# Patient Record
Sex: Female | Born: 1970 | Race: Black or African American | Hispanic: No | Marital: Single | Smoking: Never smoker
Health system: Southern US, Community
[De-identification: ages and names within clinical notes are randomized; demographics above are authoritative.]

---

## 2003-03-06 ENCOUNTER — Emergency Department (HOSPITAL_COMMUNITY): Admission: EM | Admit: 2003-03-06 | Discharge: 2003-03-06 | Payer: Self-pay | Admitting: Podiatry

## 2003-04-24 ENCOUNTER — Emergency Department (HOSPITAL_COMMUNITY): Admission: EM | Admit: 2003-04-24 | Discharge: 2003-04-24 | Payer: Self-pay | Admitting: Emergency Medicine

## 2003-06-15 ENCOUNTER — Encounter (INDEPENDENT_AMBULATORY_CARE_PROVIDER_SITE_OTHER): Payer: Self-pay | Admitting: *Deleted

## 2003-06-30 ENCOUNTER — Other Ambulatory Visit: Admission: RE | Admit: 2003-06-30 | Discharge: 2003-06-30 | Payer: Self-pay | Admitting: Family Medicine

## 2003-06-30 ENCOUNTER — Encounter: Admission: RE | Admit: 2003-06-30 | Discharge: 2003-06-30 | Payer: Self-pay | Admitting: Sports Medicine

## 2003-08-26 ENCOUNTER — Emergency Department (HOSPITAL_COMMUNITY): Admission: EM | Admit: 2003-08-26 | Discharge: 2003-08-26 | Payer: Self-pay | Admitting: Emergency Medicine

## 2003-08-26 ENCOUNTER — Encounter: Admission: RE | Admit: 2003-08-26 | Discharge: 2003-08-26 | Payer: Self-pay | Admitting: Family Medicine

## 2006-02-19 ENCOUNTER — Emergency Department (HOSPITAL_COMMUNITY): Admission: EM | Admit: 2006-02-19 | Discharge: 2006-02-19 | Payer: Self-pay | Admitting: *Deleted

## 2006-02-20 ENCOUNTER — Ambulatory Visit: Payer: Self-pay | Admitting: *Deleted

## 2006-03-16 ENCOUNTER — Ambulatory Visit: Payer: Self-pay | Admitting: Family Medicine

## 2006-05-02 ENCOUNTER — Ambulatory Visit: Payer: Self-pay | Admitting: Family Medicine

## 2006-10-12 ENCOUNTER — Encounter (INDEPENDENT_AMBULATORY_CARE_PROVIDER_SITE_OTHER): Payer: Self-pay | Admitting: *Deleted

## 2007-05-01 ENCOUNTER — Encounter (INDEPENDENT_AMBULATORY_CARE_PROVIDER_SITE_OTHER): Payer: Self-pay | Admitting: *Deleted

## 2010-09-04 ENCOUNTER — Encounter: Payer: Self-pay | Admitting: Family Medicine

## 2013-10-16 ENCOUNTER — Emergency Department (INDEPENDENT_AMBULATORY_CARE_PROVIDER_SITE_OTHER)
Admission: EM | Admit: 2013-10-16 | Discharge: 2013-10-16 | Disposition: A | Discharge status: Home / Self Care | Payer: Self-pay | Source: Home / Self Care | Attending: Emergency Medicine | Admitting: Emergency Medicine

## 2013-10-16 ENCOUNTER — Other Ambulatory Visit (HOSPITAL_COMMUNITY)
Admission: RE | Admit: 2013-10-16 | Discharge: 2013-10-16 | Disposition: A | Discharge status: Home / Self Care | Payer: Self-pay | Source: Ambulatory Visit | Attending: Emergency Medicine | Admitting: Emergency Medicine

## 2013-10-16 ENCOUNTER — Encounter (HOSPITAL_COMMUNITY): Payer: Self-pay | Admitting: Emergency Medicine

## 2013-10-16 ENCOUNTER — Emergency Department (INDEPENDENT_AMBULATORY_CARE_PROVIDER_SITE_OTHER): Payer: Self-pay

## 2013-10-16 DIAGNOSIS — R1032 Left lower quadrant pain: Secondary | ICD-10-CM

## 2013-10-16 DIAGNOSIS — S239XXA Sprain of unspecified parts of thorax, initial encounter: Secondary | ICD-10-CM

## 2013-10-16 DIAGNOSIS — Z113 Encounter for screening for infections with a predominantly sexual mode of transmission: Secondary | ICD-10-CM | POA: Insufficient documentation

## 2013-10-16 DIAGNOSIS — N760 Acute vaginitis: Secondary | ICD-10-CM | POA: Insufficient documentation

## 2013-10-16 DIAGNOSIS — IMO0002 Reserved for concepts with insufficient information to code with codable children: Secondary | ICD-10-CM

## 2013-10-16 LAB — POCT I-STAT, CHEM 8
BUN: 5 mg/dL — AB (ref 6–23)
Calcium, Ion: 1.2 mmol/L (ref 1.12–1.23)
Chloride: 101 mEq/L (ref 96–112)
Creatinine, Ser: 0.7 mg/dL (ref 0.50–1.10)
Glucose, Bld: 109 mg/dL — ABNORMAL HIGH (ref 70–99)
HCT: 41 % (ref 36.0–46.0)
Hemoglobin: 13.9 g/dL (ref 12.0–15.0)
Potassium: 3.6 mEq/L — ABNORMAL LOW (ref 3.7–5.3)
Sodium: 140 mEq/L (ref 137–147)
TCO2: 27 mmol/L (ref 0–100)

## 2013-10-16 LAB — CBC WITH DIFFERENTIAL/PLATELET
BASOS ABS: 0 10*3/uL (ref 0.0–0.1)
BASOS PCT: 0 % (ref 0–1)
EOS PCT: 1 % (ref 0–5)
Eosinophils Absolute: 0.1 10*3/uL (ref 0.0–0.7)
HEMATOCRIT: 38.8 % (ref 36.0–46.0)
Hemoglobin: 13.1 g/dL (ref 12.0–15.0)
LYMPHS PCT: 40 % (ref 12–46)
Lymphs Abs: 3.6 10*3/uL (ref 0.7–4.0)
MCH: 31.2 pg (ref 26.0–34.0)
MCHC: 33.8 g/dL (ref 30.0–36.0)
MCV: 92.4 fL (ref 78.0–100.0)
Monocytes Absolute: 0.5 10*3/uL (ref 0.1–1.0)
Monocytes Relative: 5 % (ref 3–12)
Neutro Abs: 4.8 10*3/uL (ref 1.7–7.7)
Neutrophils Relative %: 54 % (ref 43–77)
Platelets: 358 10*3/uL (ref 150–400)
RBC: 4.2 MIL/uL (ref 3.87–5.11)
RDW: 13.1 % (ref 11.5–15.5)
WBC: 9 10*3/uL (ref 4.0–10.5)

## 2013-10-16 LAB — CERVICOVAGINAL ANCILLARY ONLY
WET PREP (BD AFFIRM): NEGATIVE
Wet Prep (BD Affirm): NEGATIVE
Wet Prep (BD Affirm): POSITIVE — AB

## 2013-10-16 LAB — POCT URINALYSIS DIP (DEVICE)
Bilirubin Urine: NEGATIVE
GLUCOSE, UA: NEGATIVE mg/dL
HGB URINE DIPSTICK: NEGATIVE
KETONES UR: NEGATIVE mg/dL
Leukocytes, UA: NEGATIVE
Nitrite: NEGATIVE
PH: 6.5 (ref 5.0–8.0)
Protein, ur: NEGATIVE mg/dL
SPECIFIC GRAVITY, URINE: 1.025 (ref 1.005–1.030)
Urobilinogen, UA: 0.2 mg/dL (ref 0.0–1.0)

## 2013-10-16 LAB — POCT PREGNANCY, URINE: PREG TEST UR: NEGATIVE

## 2013-10-16 MED ORDER — METHOCARBAMOL 500 MG PO TABS: 500.0000 mg | ORAL_TABLET | Freq: Three times a day (TID) | ORAL | Status: DC

## 2013-10-16 MED ORDER — CIPROFLOXACIN HCL 500 MG PO TABS: 500.0000 mg | ORAL_TABLET | Freq: Two times a day (BID) | ORAL | Status: DC

## 2013-10-16 MED ORDER — METRONIDAZOLE 500 MG PO TABS: 500.0000 mg | ORAL_TABLET | Freq: Two times a day (BID) | ORAL | Status: DC

## 2013-10-16 MED ORDER — MELOXICAM 15 MG PO TABS: 15.0000 mg | ORAL_TABLET | Freq: Every day | ORAL | Status: DC

## 2013-10-16 NOTE — ED Notes (Signed)
Evaluated by dr Lorenz Coasterkeller prior to this nurse

## 2013-10-16 NOTE — Discharge Instructions (Signed)
Urinary Tract Infection Urinary tract infections (UTIs) can develop anywhere along your urinary tract. Your urinary tract is your body's drainage system for removing wastes and extra water. Your urinary tract includes two kidneys, two ureters, a bladder, and a urethra. Your kidneys are a pair of bean-shaped organs. Each kidney is about the size of your fist. They are located below your ribs, one on each side of your spine. CAUSES Infections are caused by microbes, which are microscopic organisms, including fungi, viruses, and bacteria. These organisms are so small that they can only be seen through a microscope. Bacteria are the microbes that most commonly cause UTIs. SYMPTOMS  Symptoms of UTIs may vary by age and gender of the patient and by the location of the infection. Symptoms in young women typically include a frequent and intense urge to urinate and a painful, burning feeling in the bladder or urethra during urination. Older women and men are more likely to be tired, shaky, and weak and have muscle aches and abdominal pain. A fever may mean the infection is in your kidneys. Other symptoms of a kidney infection include pain in your back or sides below the ribs, nausea, and vomiting. DIAGNOSIS To diagnose a UTI, your caregiver will ask you about your symptoms. Your caregiver also will ask to provide a urine sample. The urine sample will be tested for bacteria and white blood cells. White blood cells are made by your body to help fight infection. TREATMENT  Typically, UTIs can be treated with medication. Because most UTIs are caused by a bacterial infection, they usually can be treated with the use of antibiotics. The choice of antibiotic and length of treatment depend on your symptoms and the type of bacteria causing your infection. HOME CARE INSTRUCTIONS If you were prescribed antibiotics, take them exactly as your caregiver instructs you. Finish the medication even if you feel better after you have  only taken some of the medication. Drink enough water and fluids to keep your urine clear or pale yellow. Avoid caffeine, tea, and carbonated beverages. They tend to irritate your bladder. Empty your bladder often. Avoid holding urine for long periods of time. Empty your bladder before and after sexual intercourse. After a bowel movement, women should cleanse from front to back. Use each tissue only once. SEEK MEDICAL CARE IF:  You have back pain. You develop a fever. Your symptoms do not begin to resolve within 3 days. SEEK IMMEDIATE MEDICAL CARE IF:  You have severe back pain or lower abdominal pain. You develop chills. You have nausea or vomiting. You have continued burning or discomfort with urination. MAKE SURE YOU:  Understand these instructions. Will watch your condition. Will get help right away if you are not doing well or get worse. Document Released: 05/10/2005 Document Revised: 01/30/2012 Document Reviewed: 09/08/2011 [facility] Patient Information 2014 [place], [place]. Diverticulitis A diverticulum is a small pouch or sac on the colon. Diverticulosis is the presence of these diverticula on the colon. Diverticulitis is the irritation (inflammation) or infection of diverticula. CAUSES  The colon and its diverticula contain bacteria. If food particles block the tiny opening to a diverticulum, the bacteria inside can grow and cause an increase in pressure. This leads to infection and inflammation and is called diverticulitis. SYMPTOMS   Abdominal pain and tenderness. Usually, the pain is located on the left side of your abdomen. However, it could be located elsewhere.  Fever.  Bloating.  Feeling sick to your stomach (nausea).  Throwing up (vomiting).  Abnormal stools. DIAGNOSIS  Your caregiver will take a history and perform a physical exam. Since many things can cause abdominal pain, other tests may be necessary. Tests may include:  Blood tests.  Urine  tests.  X-ray of the abdomen.  CT scan of the abdomen. Sometimes, surgery is needed to determine if diverticulitis or other conditions are causing your symptoms. TREATMENT  Most of the time, you can be treated without surgery. Treatment includes:  Resting the bowels by only having liquids for a few days. As you improve, you will need to eat a low-fiber diet.  Intravenous (IV) fluids if you are losing body fluids (dehydrated).  Antibiotic medicines that treat infections may be given.  Pain and nausea medicine, if needed.  Surgery if the inflamed diverticulum has burst. HOME CARE INSTRUCTIONS   Try a clear liquid diet (broth, tea, or water for as long as directed by your caregiver). You may then gradually begin a low-fiber diet as tolerated.  A low-fiber diet is a diet with less than 10 grams of fiber. Choose the foods below to reduce fiber in the diet:  White breads, cereals, rice, and pasta.  Cooked fruits and vegetables or soft fresh fruits and vegetables without the skin.  Ground or well-cooked tender beef, ham, veal, lamb, pork, or poultry.  Eggs and seafood.  After your diverticulitis symptoms have improved, your caregiver may put you on a high-fiber diet. A high-fiber diet includes 14 grams of fiber for every 1000 calories consumed. For a standard 2000 calorie diet, you would need 28 grams of fiber. Follow these diet guidelines to help you increase the fiber in your diet. It is important to slowly increase the amount fiber in your diet to avoid gas, constipation, and bloating.  Choose whole-grain breads, cereals, pasta, and [place] rice.  Choose fresh fruits and vegetables with the skin on. Do not overcook vegetables because the more vegetables are cooked, the more fiber is lost.  Choose more nuts, seeds, legumes, dried peas, beans, and lentils.  Look for food products that have greater than 3 grams of fiber per serving on the Nutrition Facts label.  Take all medicine as  directed by your caregiver.  If your caregiver has given you a follow-up appointment, it is very important that you go. Not going could result in lasting (chronic) or permanent injury, pain, and disability. If there is any problem keeping the appointment, call to reschedule. SEEK MEDICAL CARE IF:   Your pain does not improve.  You have a hard time advancing your diet beyond clear liquids.  Your bowel movements do not return to normal. SEEK IMMEDIATE MEDICAL CARE IF:   Your pain becomes worse.  You have an oral temperature above 102 F (38.9 C), not controlled by medicine.  You have repeated vomiting.  You have bloody or black, tarry stools.  Symptoms that brought you to your caregiver become worse or are not getting better. MAKE SURE YOU:   Understand these instructions.  Will watch your condition.  Will get help right away if you are not doing well or get worse. Document Released: 05/10/2005 Document Revised: 10/23/2011 Document Reviewed: 09/05/2010 [facility] Patient Information 2014 [place], [place].  Do exercises twice daily followed by moist heat for 15 minutes.      Try to be as active as possible.  If no better in 2 weeks, follow up with orthopedist.

## 2013-10-16 NOTE — ED Notes (Signed)
Patient care delayed secondary to department acuity

## 2013-10-16 NOTE — ED Provider Notes (Signed)
Chief Complaint   Left lower quadrant pain and left upper back pain.  History of Present Illness   Faith Taylor is a 43 year old female who has had a four-day history of crampy left lower quadrant pain when she urinates and left upper back pain around the shoulder blade. She denies any dysuria, frequency, urgency, or hematuria. She has noted some odor to her urine. She's felt chilled not had any fevers. No vomiting, nausea, anorexia, or diarrhea. No blood in the stool. She does have a history of urinary tract infections in the past. The last one was last summer. She denies any GYN complaints such as discharge, itching, but she does have some pelvic pain. She has a Mirena IUD in place. The upper back pain is worse with certain movements and positions. She thinks it might have been due to carrying a heavy book bag in school.  Review of Systems   Other than as noted above, the patient denies any of the following symptoms: General:  No fevers, chills, or sweats. GI:  No abdominal pain, back pain, nausea, vomiting, diarrhea, or constipation. GU:  No dysuria, frequency, urgency, hematuria, or incontinence. GYN:  No discharge, itching, vulvar pain or lesions, pelvic pain, or abnormal vaginal bleeding.  PMFSH   Past medical history, family history, social history, meds, and allergies were reviewed.    Physical Examination     Vital signs:  BP 156/84  Pulse 90  Temp(Src) 98.4 F (36.9 C) (Oral)  Resp 16  SpO2 98%  LMP 09/05/2013 Gen:  Alert, oriented, in no distress. Lungs:  Clear to auscultation, no wheezes, rales or rhonchi. Heart:  Regular rhythm, no gallop or murmer. Back: There was mild left CVA tenderness to palpation and more marked at tenderness to palpation in the upper back just adjacent to the shoulder blade on the left Abdomen:  Flat and soft. There was slight right lower quadrant and left lower quadrant pain to palpation.  No guarding, or rebound.  No hepato-splenomegaly  or mass.  Bowel sounds were normally active.  No hernia. Pelvic exam: Normal external genitalia. Vaginal and cervical mucosa were normal. There was no discharge. IUD string was not seen or felt. There was no pain on cervical motion. Uterus was normal size and shape and nontender. No adnexal masses or tenderness.  DNA probes for gonorrhea, Chlamydia, Trichomonas, Gardnerella, and Candida were obtained. Back:  No CVA tenderness.  Skin:  Clear, warm and dry.  Labs   Results for orders placed during the hospital encounter of 10/16/13  CBC WITH DIFFERENTIAL      Result Value Ref Range   WBC 9.0  4.0 - 10.5 K/uL   RBC 4.20  3.87 - 5.11 MIL/uL   Hemoglobin 13.1  12.0 - 15.0 g/dL   HCT 40.9  81.1 - 91.4 %   MCV 92.4  78.0 - 100.0 fL   MCH 31.2  26.0 - 34.0 pg   MCHC 33.8  30.0 - 36.0 g/dL   RDW 78.2  95.6 - 21.3 %   Platelets 358  150 - 400 K/uL   Neutrophils Relative % 54  43 - 77 %   Neutro Abs 4.8  1.7 - 7.7 K/uL   Lymphocytes Relative 40  12 - 46 %   Lymphs Abs 3.6  0.7 - 4.0 K/uL   Monocytes Relative 5  3 - 12 %   Monocytes Absolute 0.5  0.1 - 1.0 K/uL   Eosinophils Relative 1  0 - 5 %  Eosinophils Absolute 0.1  0.0 - 0.7 K/uL   Basophils Relative 0  0 - 1 %   Basophils Absolute 0.0  0.0 - 0.1 K/uL  POCT URINALYSIS DIP (DEVICE)      Result Value Ref Range   Glucose, UA NEGATIVE  NEGATIVE mg/dL   Bilirubin Urine NEGATIVE  NEGATIVE   Ketones, ur NEGATIVE  NEGATIVE mg/dL   Specific Gravity, Urine 1.025  1.005 - 1.030   Hgb urine dipstick NEGATIVE  NEGATIVE   pH 6.5  5.0 - 8.0   Protein, ur NEGATIVE  NEGATIVE mg/dL   Urobilinogen, UA 0.2  0.0 - 1.0 mg/dL   Nitrite NEGATIVE  NEGATIVE   Leukocytes, UA NEGATIVE  NEGATIVE  POCT PREGNANCY, URINE      Result Value Ref Range   Preg Test, Ur NEGATIVE  NEGATIVE  POCT I-STAT, CHEM 8      Result Value Ref Range   Sodium 140  137 - 147 mEq/L   Potassium 3.6 (*) 3.7 - 5.3 mEq/L   Chloride 101  96 - 112 mEq/L   BUN 5 (*) 6 - 23 mg/dL    Creatinine, Ser 8.11  0.50 - 1.10 mg/dL   Glucose, Bld 914 (*) 70 - 99 mg/dL   Calcium, Ion 7.82  1.12 - 1.23 mmol/L   TCO2 27  0 - 100 mmol/L   Hemoglobin 13.9  12.0 - 15.0 g/dL   HCT 95.6  21.3 - 08.6 %    A urine culture was obtained.  Results are pending at this time and we will call about any positive results.  Radiology   Dg Abd 1 View  10/16/2013   CLINICAL DATA:  Mid back pain and abdominal cramping  EXAM: ABDOMEN - 1 VIEW  COMPARISON:  None.  FINDINGS: There is a moderate stool burden within the colon. There is no evidence of bowel obstruction. No abnormal soft tissue calcifications are demonstrated. The bony structures are normal in appearance were visualized. An IUD is in place.  IMPRESSION: There is mildly increased stool burden which may reflect constipation in the appropriate clinical setting. There is no evidence of bowel obstruction or ileus nor other acute intra-abdominal abnormality.   Electronically Signed   By: Jomar Denz  [place]   On: 10/16/2013 10:19   I reviewed the images independently and personally and concur with the radiologist's findings.  Assessment   The primary encounter diagnosis was LLQ pain. A diagnosis of Thoracic sprain and strain was also pertinent to this visit.   No evidence of pyelonephritis.    Differential diagnosis of left lower quadrant pain his urinary tract infection, mild diverticulitis, and I do not think she has PID or appendicitis. Differential diagnosis of the upper back pain is muscular strain versus kidney pain. I think any pain is unlikely given her normal urinalysis.   Plan   1.  Meds:  The following meds were prescribed:   New Prescriptions   CIPROFLOXACIN (CIPRO) 500 MG TABLET    Take 1 tablet (500 mg total) by mouth every 12 (twelve) hours.   MELOXICAM (MOBIC) 15 MG TABLET    Take 1 tablet (15 mg total) by mouth daily.   METHOCARBAMOL (ROBAXIN) 500 MG TABLET    Take 1 tablet (500 mg total) by mouth 3 (three) times daily.    METRONIDAZOLE (FLAGYL) 500 MG TABLET    Take 1 tablet (500 mg total) by mouth 2 (two) times daily.    2.  Patient Education/Counseling:  The patient was given  appropriate handouts, self care instructions, and instructed in symptomatic relief. The patient was told to avoid intercourse for 10 days, get extra fluids, and return for a follow up with her primary care doctor at the completion of treatment for a repeat UA and culture.  Given back exercises.  3.  Follow up:  The patient was told to follow up here if no better in 3 to 4 days, or sooner if becoming worse in any way, and given some red flag symptoms such as fever, persistent vomiting, or severe flank or abdominal pain which would prompt immediate return.     Reuben Likesavid C Margretta Zamorano, MD 10/16/13 1106

## 2013-10-17 LAB — URINE CULTURE

## 2013-10-17 LAB — CERVICOVAGINAL ANCILLARY ONLY
Chlamydia: NEGATIVE
Neisseria Gonorrhea: NEGATIVE

## 2013-10-17 NOTE — ED Notes (Signed)
GC/Chlamydia neg., Affirm: Candida and Trich neg., Gardnerella pos., Urine culture:  15,000 colonies multiple bacterial types none predominant. Pt. adequately treated with Flagyl. Vassie MoselleYork, Tilla Wilborn M 10/17/2013

## 2014-09-10 ENCOUNTER — Emergency Department (INDEPENDENT_AMBULATORY_CARE_PROVIDER_SITE_OTHER)
Admission: EM | Admit: 2014-09-10 | Discharge: 2014-09-10 | Disposition: A | Discharge status: Home / Self Care | Payer: Self-pay | Source: Home / Self Care | Attending: Family Medicine | Admitting: Family Medicine

## 2014-09-10 ENCOUNTER — Encounter (HOSPITAL_COMMUNITY): Payer: Self-pay | Admitting: Emergency Medicine

## 2014-09-10 DIAGNOSIS — R6889 Other general symptoms and signs: Secondary | ICD-10-CM

## 2014-09-10 LAB — POCT RAPID STREP A: Streptococcus, Group A Screen (Direct): NEGATIVE

## 2014-09-10 NOTE — ED Provider Notes (Signed)
CSN: [id number]     Arrival date & time 09/10/14  0900 History   First MD Initiated Contact with Patient 09/10/14 [phone]     Chief Complaint  Patient presents with  . Fever  . Generalized Body Aches  . Cough  . Diarrhea   (Consider location/radiation/quality/duration/timing/severity/associated sxs/prior Treatment) HPI  5 days ago developed fatigue and aches. 4 days ago developed fever to 102.6 adn cough. Associated w/ sore throat and occasional phlegm production, and mild intermittent post nasal drip. Theraflu and motrin w/ some benefit.  Overall no change in overall condition. Symptoms are worse at night. deneis abd pain, dysuria, rash, CP, SOB.     History reviewed. No pertinent past medical history. History reviewed. No pertinent past surgical history. History reviewed. No pertinent family history. History  Substance Use Topics  . Smoking status: Never Smoker   . Smokeless tobacco: Never Used  . Alcohol Use: No   OB History    No data available     Review of Systems Per HPI with all other pertinent systems negative.   Allergies  Review of patient's allergies indicates no known allergies.  Home Medications   Prior to Admission medications   Medication Sig Start Date End Date Taking? Authorizing Provider  ciprofloxacin (CIPRO) 500 MG tablet Take 1 tablet (500 mg total) by mouth every 12 (twelve) hours. 10/16/13   Reuben Likesavid C Keller, MD  meloxicam (MOBIC) 15 MG tablet Take 1 tablet (15 mg total) by mouth daily. 10/16/13   Reuben Likesavid C Keller, MD  methocarbamol (ROBAXIN) 500 MG tablet Take 1 tablet (500 mg total) by mouth 3 (three) times daily. 10/16/13   Reuben Likesavid C Keller, MD  metroNIDAZOLE (FLAGYL) 500 MG tablet Take 1 tablet (500 mg total) by mouth 2 (two) times daily. 10/16/13   Reuben Likesavid C Keller, MD   BP 151/93 mmHg  Pulse 116  Temp(Src) 98.3 F (36.8 C) (Oral)  Resp 16  SpO2 100%  LMP 09/01/2014 (Exact Date) Physical Exam  Constitutional: She is oriented to person, place, and time.  She appears well-developed and well-nourished. No distress.  HENT:  Head: Normocephalic and atraumatic.  Eyes: EOM are normal. Pupils are equal, round, and reactive to light.  Neck: Normal range of motion.  Cardiovascular: Normal rate, normal heart sounds and intact distal pulses.  Exam reveals no gallop.   No murmur heard. Pulmonary/Chest: Effort normal and breath sounds normal.  Abdominal: Soft. Bowel sounds are normal.  Musculoskeletal: She exhibits no edema or tenderness.  Neurological: She is alert and oriented to person, place, and time. No cranial nerve deficit. Coordination normal.  Skin: Skin is warm. She is not diaphoretic.  Psychiatric: She has a normal mood and affect. Her behavior is normal. Judgment and thought content normal.    ED Course  Procedures (including critical care time) Labs Review Labs Reviewed - No data to display  Imaging Review No results found.   MDM   1. Flu-like symptoms    Likely flu. Outside window for tamiflu NSaids and tylenol, fluids and rest Rapid strep negative.  Precautions given and all questions answered  Shelly Flattenavid Merrell, MD Family Medicine 09/10/2014, 9:46 AM      Ozella Rocksavid J Merrell, MD 09/10/14 [id number]

## 2014-09-10 NOTE — Discharge Instructions (Signed)
You likely have the flu This should start to get better in the next 1-3 days Please start taking a probiotic and or yogurt to help with the diarrhea. Please use tylenol and ibuprofen for fevers and aches Please stay well hydrated and get lots or rest.  Please call if you do not get better in another r3-4 days

## 2014-09-10 NOTE — ED Notes (Signed)
Pt has been suffering from a cough, fever, diarrhea and generalized body aches for the last five days.

## 2014-09-12 LAB — CULTURE, GROUP A STREP

## 2014-09-15 ENCOUNTER — Emergency Department (INDEPENDENT_AMBULATORY_CARE_PROVIDER_SITE_OTHER): Payer: Self-pay

## 2014-09-15 ENCOUNTER — Emergency Department (INDEPENDENT_AMBULATORY_CARE_PROVIDER_SITE_OTHER)
Admission: EM | Admit: 2014-09-15 | Discharge: 2014-09-15 | Disposition: A | Discharge status: Home / Self Care | Payer: Self-pay | Source: Home / Self Care | Attending: Family Medicine | Admitting: Family Medicine

## 2014-09-15 ENCOUNTER — Encounter (HOSPITAL_COMMUNITY): Payer: Self-pay | Admitting: Emergency Medicine

## 2014-09-15 DIAGNOSIS — J9811 Atelectasis: Secondary | ICD-10-CM

## 2014-09-15 DIAGNOSIS — J189 Pneumonia, unspecified organism: Secondary | ICD-10-CM

## 2014-09-15 MED ORDER — LEVOFLOXACIN 500 MG PO TABS: 500.0000 mg | ORAL_TABLET | Freq: Every day | ORAL | Status: DC

## 2014-09-15 NOTE — ED Provider Notes (Signed)
Faith Taylor is a 44 y.o. female who presents to Urgent Care today for Body aches fever headache and cough. Symptoms present now for about a week. Patient was seen on the 28th and thought to have flu. She's tried TheraFlu and Robitussin and Motrin which have helped a bit. Recently she developed a barking cough. No trouble breathing or wheezing. Cough is productive.   History reviewed. No pertinent past medical history. History reviewed. No pertinent past surgical history. History  Substance Use Topics  . Smoking status: Never Smoker   . Smokeless tobacco: Never Used  . Alcohol Use: No   ROS as above Medications: No current facility-administered medications for this encounter.   Current Outpatient Prescriptions  Medication Sig Dispense Refill  . levofloxacin (LEVAQUIN) 500 MG tablet Take 1 tablet (500 mg total) by mouth daily. 10 tablet 0  . meloxicam (MOBIC) 15 MG tablet Take 1 tablet (15 mg total) by mouth daily. 15 tablet 0  . methocarbamol (ROBAXIN) 500 MG tablet Take 1 tablet (500 mg total) by mouth 3 (three) times daily. 30 tablet 0   No Known Allergies   Exam:  BP 141/97 mmHg  Pulse 107  Temp(Src) 98.3 F (36.8 C) (Oral)  Resp 16  SpO2 95%  LMP 09/01/2014 (Exact Date) Gen: Well NAD HEENT: EOMI,  MMM Lungs: Normal work of breathing. Course right-sided breath sounds Heart: RRR no MRG Abd: NABS, Soft. Nondistended, Nontender Exts: Brisk capillary refill, warm and well perfused.   No results found for this or any previous visit (from the past 24 hour(s)). Dg Chest 2 View  09/15/2014   CLINICAL DATA:  Patient has a for 1 week.  EXAM: CHEST  2 VIEW  COMPARISON:  None.  FINDINGS: There is elevation of the right diaphragm. There is a linear band of airspace disease at the left lung base likely reflecting atelectasis or scarring. There is no focal parenchymal opacity, pleural effusion, or pneumothorax. The heart and mediastinal contours are unremarkable.  The osseous  structures are unremarkable.  IMPRESSION: There is a linear band of airspace disease at the left lung base likely reflecting atelectasis or scarring. No active cardiopulmonary disease.   Electronically Signed   By: Elige KoHetal  Patel   On: 09/15/2014 19:53    Assessment and Plan: 44 y.o. female with atelectasis and probable community-acquired pneumonia. Treat with Levaquin. Discussed with radiology who recommends repeat chest x-ray in a few days. Return to clinic in 3 days. Additionally we'll use an incentive spirometer  Discussed warning signs or symptoms. Please see discharge instructions. Patient expresses understanding.     Rodolph BongEvan S Anya Murphey, MD 09/15/14 2026

## 2014-09-15 NOTE — Discharge Instructions (Signed)
Thank you for coming in today. Call or go to the emergency room if you get worse, have trouble breathing, have chest pains, or palpitations Return in 3 days for repeat chest x-ray Use the incentive spirometer   Pneumonia Pneumonia is an infection of the lungs.  CAUSES Pneumonia may be caused by bacteria or a virus. Usually, these infections are caused by breathing infectious particles into the lungs (respiratory tract). SIGNS AND SYMPTOMS   Cough.  Fever.  Chest pain.  Increased rate of breathing.  Wheezing.  Mucus production. DIAGNOSIS  If you have the common symptoms of pneumonia, your health care provider will typically confirm the diagnosis with a chest X-ray. The X-ray will show an abnormality in the lung (pulmonary infiltrate) if you have pneumonia. Other tests of your blood, urine, or sputum may be done to find the specific cause of your pneumonia. Your health care provider may also do tests (blood gases or pulse oximetry) to see how well your lungs are working. TREATMENT  Some forms of pneumonia may be spread to other people when you cough or sneeze. You may be asked to wear a mask before and during your exam. Pneumonia that is caused by bacteria is treated with antibiotic medicine. Pneumonia that is caused by the influenza virus may be treated with an antiviral medicine. Most other viral infections must run their course. These infections will not respond to antibiotics.  HOME CARE INSTRUCTIONS   Cough suppressants may be used if you are losing too much rest. However, coughing protects you by clearing your lungs. You should avoid using cough suppressants if you can.  Your health care provider may have prescribed medicine if he or she thinks your pneumonia is caused by bacteria or influenza. Finish your medicine even if you start to feel better.  Your health care provider may also prescribe an expectorant. This loosens the mucus to be coughed up.  Take medicines only as  directed by your health care provider.  Do not smoke. Smoking is a common cause of bronchitis and can contribute to pneumonia. If you are a smoker and continue to smoke, your cough may last several weeks after your pneumonia has cleared.  A cold steam vaporizer or humidifier in your room or home may help loosen mucus.  Coughing is often worse at night. Sleeping in a semi-upright position in a recliner or using a couple pillows under your head will help with this.  Get rest as you feel it is needed. Your body will usually let you know when you need to rest. PREVENTION A pneumococcal shot (vaccine) is available to prevent a common bacterial cause of pneumonia. This is usually suggested for:  People over 47 years old.  Patients on chemotherapy.  People with chronic lung problems, such as bronchitis or emphysema.  People with immune system problems. If you are over 65 or have a high risk condition, you may receive the pneumococcal vaccine if you have not received it before. In some countries, a routine influenza vaccine is also recommended. This vaccine can help prevent some cases of pneumonia.You may be offered the influenza vaccine as part of your care. If you smoke, it is time to quit. You may receive instructions on how to stop smoking. Your health care provider can provide medicines and counseling to help you quit. SEEK MEDICAL CARE IF: You have a fever. SEEK IMMEDIATE MEDICAL CARE IF:   Your illness becomes worse. This is especially true if you are elderly or weakened from  any other disease.  You cannot control your cough with suppressants and are losing sleep.  You begin coughing up blood.  You develop pain which is getting worse or is uncontrolled with medicines.  Any of the symptoms which initially brought you in for treatment are getting worse rather than better.  You develop shortness of breath or chest pain. MAKE SURE YOU:   Understand these instructions.  Will watch  your condition.  Will get help right away if you are not doing well or get worse. Document Released: 07/31/2005 Document Revised: 12/15/2013 Document Reviewed: 10/20/2010 [employer] Patient Information 2015 [place], [place]. This information is not intended to replace advice given to you by your health care provider. Make sure you discuss any questions you have with your health care provider.    Atelectasis Atelectasis is a collapse of the small air sacs in the lungs (alveoli). When this occurs, all or part of a lung collapses and becomes airless. It can be caused by various things and is a common problem after surgery. The severity of atelectasis will vary depending on the size of the area involved and the underlying cause of the condition. CAUSES  There are multiple causes for atelectasis:   Shallow breathing, particularly if there is an injury to your chest wall or abdomen that makes it painful to take a deep breath. This commonly occurs after surgery.  Obstruction of your airways (bronchi or bronchioles). This may be caused by a buildup of mucus (mucus plug), tumors, blood clots (pulmonary embolus), or inhaled foreign bodies. Mucus plugs occur when the lungs do not expand enough to get rid of mucus.  Outside pressure on the lung. This may be caused by tumors, fluid (pleural effusion), or a leakage of air between the lung and rib cage (pneumothorax).   Infections such as pneumonia.  Scarring in lung tissue left over from previous infection or injury.  Some diseases such as cystic fibrosis. SIGNS AND SYMPTOMS  Often, atelectasis will have no symptoms. When symptoms occur, they include:  Shortness of breath.   Bluish color to your nails, lips, or mouth (cyanosis). DIAGNOSIS  Your health care provider may suspect atelectasis based on symptoms and physical findings. A chest X-ray may be done to confirm the diagnosis. More specialized X-ray exams are sometimes required.  TREATMENT    Treatment will depend on the cause of the atelectasis. Treatment may include:  Purposeful coughing to loosen mucus plugs in the lungs.  Chest physiotherapy. This consists of clapping or percussion on the chest over the lungs to further loosen mucus plugs.  Postural drainage techniques. This involves positioning your body so your head is lower than your chest. HOME CARE INSTRUCTIONS  Practice relaxed deep breathing whenever you are sitting down. A good technique is to take a few relaxed deep breaths each time a commercial comes on if you are watching television.  If you were given a deep breathing device (such as an incentive spirometer) or a mucus clearance device, use this regularly as directed by your health care provider.  Try to cough several times a day as directed by your health care provider.  Perform any chest physiotherapy or postural drainage techniques as directed by your health care provider. If necessary, have someone (such as a family member) assist you with these techniques.  When lying down, lie on the unaffected side to encourage mucus drainage.  Stay physically active as much as possible. SEEK IMMEDIATE MEDICAL CARE IF:   You develop increasing problems with  your breathing.   You develop severe chest pain.   You develop severe coughing, or you cough up blood.   You have a fever or persistent symptoms for more than 2-3 days.   You have a fever and your symptoms suddenly get worse.  MAKE SURE YOU:  Understand these instructions.  Will watch your condition.  Will get help right away if you are not doing well or get worse. Document Released: 07/31/2005 Document Revised: 08/05/2013 Document Reviewed: 02/05/2013 [facility] Patient Information 2015 [place], [place]. This information is not intended to replace advice given to you by your health care provider. Make sure you discuss any questions you have with your health care provider.

## 2014-09-15 NOTE — ED Notes (Signed)
Pt reports being seen on 1/28.  States not feeling any better.   On set of fever and headache last night.  Productive cough with yellow sputum.  Mild sob, with activity.   Symptoms worse at night.  Feeling light headed during the day.  No relief with otc meds.

## 2014-09-18 ENCOUNTER — Emergency Department (INDEPENDENT_AMBULATORY_CARE_PROVIDER_SITE_OTHER)
Admission: EM | Admit: 2014-09-18 | Discharge: 2014-09-18 | Disposition: A | Discharge status: Home / Self Care | Payer: Self-pay | Source: Home / Self Care | Attending: Emergency Medicine | Admitting: Emergency Medicine

## 2014-09-18 ENCOUNTER — Encounter (HOSPITAL_COMMUNITY): Payer: Self-pay

## 2014-09-18 ENCOUNTER — Emergency Department (INDEPENDENT_AMBULATORY_CARE_PROVIDER_SITE_OTHER): Payer: Self-pay

## 2014-09-18 DIAGNOSIS — J189 Pneumonia, unspecified organism: Secondary | ICD-10-CM

## 2014-09-18 NOTE — Discharge Instructions (Signed)
Your x-ray looks better, but you do have pneumonia. Please finish the course of Levaquin. Follow-up if your symptoms do not continue to improve or if you develop fevers again.

## 2014-09-18 NOTE — ED Provider Notes (Signed)
CSN: [id number]     Arrival date & time 09/18/14  [id number] History   First MD Initiated Contact with Patient 09/18/14 [phone]     Chief Complaint  Patient presents with  . Follow-up   (Consider location/radiation/quality/duration/timing/severity/associated sxs/prior Treatment) HPI She is a 44 year old woman here for follow-up. She was seen here on February 2 and diagnosed with likely community acquired pneumonia. She was started on Levaquin at that time. Today, she states she is feeling much better. She continues to have a cough that is productive of clear sputum. She also reports some mild diarrhea and abdominal cramps, which she attributes to the antibiotic. No fevers or chills. No shortness of breath. She has been using the incentive spirometer and feels that that is helping.  History reviewed. No pertinent past medical history. History reviewed. No pertinent past surgical history. History reviewed. No pertinent family history. History  Substance Use Topics  . Smoking status: Never Smoker   . Smokeless tobacco: Never Used  . Alcohol Use: No   OB History    No data available     Review of Systems As in history of present illness Allergies  Review of patient's allergies indicates no known allergies.  Home Medications   Prior to Admission medications   Medication Sig Start Date End Date Taking? Authorizing Provider  levofloxacin (LEVAQUIN) 500 MG tablet Take 1 tablet (500 mg total) by mouth daily. 09/15/14   Rodolph BongEvan S Corey, MD  meloxicam (MOBIC) 15 MG tablet Take 1 tablet (15 mg total) by mouth daily. 10/16/13   Reuben Likesavid C Keller, MD  methocarbamol (ROBAXIN) 500 MG tablet Take 1 tablet (500 mg total) by mouth 3 (three) times daily. 10/16/13   Reuben Likesavid C Keller, MD   BP 160/100 mmHg  Pulse 92  Temp(Src) 98.5 F (36.9 C) (Oral)  Resp 20  SpO2 97%  LMP 09/01/2014 (Exact Date) Physical Exam  Constitutional: She is oriented to person, place, and time. She appears well-developed and well-nourished.  No distress.  Neck: Neck supple.  Cardiovascular: Normal rate, regular rhythm and normal heart sounds.   No murmur heard. Pulmonary/Chest: Effort normal and breath sounds normal. No respiratory distress. She has no wheezes. She has no rales.  Neurological: She is alert and oriented to person, place, and time.    ED Course  Procedures (including critical care time) Labs Review Labs Reviewed - No data to display  Imaging Review Dg Chest 2 View  09/18/2014   CLINICAL DATA:  Persistent cough  EXAM: CHEST  2 VIEW  COMPARISON:  September 15, 2014  FINDINGS: There has been considerable interval clearing of airspace consolidation from the left lower lobe. A mild degree of patchy opacity remains in this area. An area of mild atelectatic change in the right lower lobe noted on recent prior study has resolved. There is a new small area of infiltrate in the lingula, however. The heart size and pulmonary vascularity are within normal limits. No adenopathy. No bone lesions.  IMPRESSION: Significant partial clearing of left lower lobe infiltrate. New small area of infiltrate in the lingula.   Electronically Signed   By: Bretta BangWilliam  Woodruff M.D.   On: 09/18/2014 09:15     MDM   1. CAP (community acquired pneumonia)    Some interval improvement in the chest x-ray, but continues to have areas of infiltrate. This is not unexpected. She will complete her course of Levaquin. Reviewed reasons to return as in after visit summary.    Charm RingsErin J Montray Kliebert, MD  09/18/14 0929 

## 2014-09-18 NOTE — ED Notes (Signed)
Recheck, states she feels better

## 2014-09-25 ENCOUNTER — Emergency Department (HOSPITAL_COMMUNITY): Payer: Self-pay

## 2014-09-25 ENCOUNTER — Emergency Department (INDEPENDENT_AMBULATORY_CARE_PROVIDER_SITE_OTHER): Payer: Self-pay

## 2014-09-25 ENCOUNTER — Encounter (HOSPITAL_COMMUNITY): Payer: Self-pay | Admitting: *Deleted

## 2014-09-25 ENCOUNTER — Emergency Department (INDEPENDENT_AMBULATORY_CARE_PROVIDER_SITE_OTHER)
Admission: EM | Admit: 2014-09-25 | Discharge: 2014-09-25 | Disposition: A | Discharge status: Home / Self Care | Payer: Self-pay | Source: Home / Self Care | Attending: Family Medicine | Admitting: Family Medicine

## 2014-09-25 DIAGNOSIS — J189 Pneumonia, unspecified organism: Secondary | ICD-10-CM

## 2014-09-25 NOTE — ED Notes (Signed)
Pt  Reports    Pain   In  Back   Worse  On  Movement           Still  Has  A  Cough  Better          Completed   Anti  Biotics            Last  Pm         Here  For  followup  From  pnuemonia

## 2014-09-25 NOTE — Discharge Instructions (Signed)
Continue with plenty of fluids, advil for soreness, normal diet and activity, see your doctoras needed.

## 2014-09-25 NOTE — ED Provider Notes (Signed)
CSN: [id number]     Arrival date & time 09/25/14  1155 History   First MD Initiated Contact with Patient 09/25/14 1238     Chief Complaint  Patient presents with  . Follow-up   (Consider location/radiation/quality/duration/timing/severity/associated sxs/prior Treatment) Patient is a 44 y.o. female presenting with cough. The history is provided by the patient.  Cough Cough characteristics:  Non-productive and dry Severity:  Mild Progression:  Improving Chronicity:  New Smoker: no   Context comment:  Seen 2/2 and dx'd with pna, given abx, has completed course of med, still sl soreness right scapular back. Associated symptoms: chest pain   Associated symptoms: no fever, no shortness of breath and no wheezing     History reviewed. No pertinent past medical history. History reviewed. No pertinent past surgical history. No family history on file. History  Substance Use Topics  . Smoking status: Never Smoker   . Smokeless tobacco: Never Used  . Alcohol Use: No   OB History    No data available     Review of Systems  Constitutional: Negative.  Negative for fever.  Respiratory: Positive for cough. Negative for shortness of breath and wheezing.   Cardiovascular: Positive for chest pain.    Allergies  Review of patient's allergies indicates no known allergies.  Home Medications   Prior to Admission medications   Medication Sig Start Date End Date Taking? Authorizing Provider  levofloxacin (LEVAQUIN) 500 MG tablet Take 1 tablet (500 mg total) by mouth daily. 09/15/14   Rodolph BongEvan S Corey, MD  meloxicam (MOBIC) 15 MG tablet Take 1 tablet (15 mg total) by mouth daily. 10/16/13   Reuben Likesavid C Keller, MD  methocarbamol (ROBAXIN) 500 MG tablet Take 1 tablet (500 mg total) by mouth 3 (three) times daily. 10/16/13   Reuben Likesavid C Keller, MD   BP 173/101 mmHg  Pulse 107  Temp(Src) 98.3 F (36.8 C) (Oral)  Resp 16  SpO2 97%  LMP 09/01/2014 (Exact Date) Physical Exam  Constitutional: She is oriented to  person, place, and time. She appears well-developed and well-nourished.  Cardiovascular: Normal rate, regular rhythm, normal heart sounds and intact distal pulses.   Pulmonary/Chest: Effort normal and breath sounds normal. She exhibits tenderness.  Neurological: She is alert and oriented to person, place, and time.  Skin: Skin is warm and dry.  Nursing note and vitals reviewed.   ED Course  Procedures (including critical care time) Labs Review Labs Reviewed - No data to display  Imaging Review No results found.  X-rays reviewed and report per radiologist.  MDM  No diagnosis found.     Linna HoffJames D Jerrell Mangel, MD 09/25/14 1500

## 2014-10-06 ENCOUNTER — Telehealth (HOSPITAL_COMMUNITY): Payer: Self-pay | Admitting: Family Medicine

## 2014-10-06 NOTE — ED Notes (Signed)
FMLA paperwork filled out.  Rodolph BongEvan S Winni Ehrhard, MD 10/06/14 [phone]

## 2015-01-21 ENCOUNTER — Encounter (HOSPITAL_COMMUNITY): Payer: Self-pay | Admitting: Emergency Medicine

## 2015-01-21 ENCOUNTER — Emergency Department (INDEPENDENT_AMBULATORY_CARE_PROVIDER_SITE_OTHER)
Admission: EM | Admit: 2015-01-21 | Discharge: 2015-01-21 | Disposition: A | Discharge status: Home / Self Care | Payer: Self-pay | Source: Home / Self Care | Attending: Family Medicine | Admitting: Family Medicine

## 2015-01-21 DIAGNOSIS — N3000 Acute cystitis without hematuria: Secondary | ICD-10-CM

## 2015-01-21 LAB — POCT URINALYSIS DIP (DEVICE)
GLUCOSE, UA: 250 mg/dL — AB
NITRITE: POSITIVE — AB
PH: 5 (ref 5.0–8.0)
Protein, ur: >=300 mg/dL — AB
SPECIFIC GRAVITY, URINE: 1.015 (ref 1.005–1.030)
UROBILINOGEN UA: 2 mg/dL — AB (ref 0.0–1.0)

## 2015-01-21 MED ORDER — CEPHALEXIN 500 MG PO CAPS: 500.0000 mg | ORAL_CAPSULE | Freq: Two times a day (BID) | ORAL | Status: DC

## 2015-01-21 NOTE — Discharge Instructions (Signed)
Thank you for coming in today. If your belly pain worsens, or you have high fever, bad vomiting, blood in your stool or black tarry stool go to the Emergency Room.   Urinary Tract Infection Urinary tract infections (UTIs) can develop anywhere along your urinary tract. Your urinary tract is your body's drainage system for removing wastes and extra water. Your urinary tract includes two kidneys, two ureters, a bladder, and a urethra. Your kidneys are a pair of bean-shaped organs. Each kidney is about the size of your fist. They are located below your ribs, one on each side of your spine. CAUSES Infections are caused by microbes, which are microscopic organisms, including fungi, viruses, and bacteria. These organisms are so small that they can only be seen through a microscope. Bacteria are the microbes that most commonly cause UTIs. SYMPTOMS  Symptoms of UTIs may vary by age and gender of the patient and by the location of the infection. Symptoms in young women typically include a frequent and intense urge to urinate and a painful, burning feeling in the bladder or urethra during urination. Older women and men are more likely to be tired, shaky, and weak and have muscle aches and abdominal pain. A fever may mean the infection is in your kidneys. Other symptoms of a kidney infection include pain in your back or sides below the ribs, nausea, and vomiting. DIAGNOSIS To diagnose a UTI, your caregiver will ask you about your symptoms. Your caregiver also will ask to provide a urine sample. The urine sample will be tested for bacteria and white blood cells. White blood cells are made by your body to help fight infection. TREATMENT  Typically, UTIs can be treated with medication. Because most UTIs are caused by a bacterial infection, they usually can be treated with the use of antibiotics. The choice of antibiotic and length of treatment depend on your symptoms and the type of bacteria causing your  infection. HOME CARE INSTRUCTIONS  If you were prescribed antibiotics, take them exactly as your caregiver instructs you. Finish the medication even if you feel better after you have only taken some of the medication.  Drink enough water and fluids to keep your urine clear or pale yellow.  Avoid caffeine, tea, and carbonated beverages. They tend to irritate your bladder.  Empty your bladder often. Avoid holding urine for long periods of time.  Empty your bladder before and after sexual intercourse.  After a bowel movement, women should cleanse from front to back. Use each tissue only once. SEEK MEDICAL CARE IF:   You have back pain.  You develop a fever.  Your symptoms do not begin to resolve within 3 days. SEEK IMMEDIATE MEDICAL CARE IF:   You have severe back pain or lower abdominal pain.  You develop chills.  You have nausea or vomiting.  You have continued burning or discomfort with urination. MAKE SURE YOU:   Understand these instructions.  Will watch your condition.  Will get help right away if you are not doing well or get worse. Document Released: 05/10/2005 Document Revised: 01/30/2012 Document Reviewed: 09/08/2011 [employer] Patient Information 2015 [employer]. This information is not intended to replace advice given to you by your health care provider. Make sure you discuss any questions you have with your health care provider.  

## 2015-01-21 NOTE — ED Provider Notes (Signed)
Faith Taylor is a 44 y.o. female who presents to Urgent Care today for urinary frequency urgency and dysuria present for 2 days. Symptoms are consistent with previous episodes of UTI. She is using AZO which helps. No vomiting or diarrhea or vaginal discharge.   History reviewed. No pertinent past medical history. History reviewed. No pertinent past surgical history. History  Substance Use Topics  . Smoking status: Never Smoker   . Smokeless tobacco: Never Used  . Alcohol Use: No   ROS as above Medications: No current facility-administered medications for this encounter.   Current Outpatient Prescriptions  Medication Sig Dispense Refill  . cephALEXin (KEFLEX) 500 MG capsule Take 1 capsule (500 mg total) by mouth 2 (two) times daily. 14 capsule 0   No Known Allergies   Exam:  BP 146/83 mmHg  Pulse 93  Temp(Src) 98.9 F (37.2 C) (Oral)  Resp 14  SpO2 98%  LMP 12/22/2014 Gen: Well NAD HEENT: EOMI,  MMM Lungs: Normal work of breathing. CTABL Heart: RRR no MRG Abd: NABS, Soft. Nondistended, Nontender no CV angle tenderness to percussion Exts: Brisk capillary refill, warm and well perfused.   Results for orders placed or performed during the hospital encounter of 01/21/15 (from the past 24 hour(s))  POCT urinalysis dip (device)     Status: Abnormal   Collection Time: 01/21/15  7:29 PM  Result Value Ref Range   Glucose, UA 250 (A) NEGATIVE mg/dL   Bilirubin Urine SMALL (A) NEGATIVE   Ketones, ur TRACE (A) NEGATIVE mg/dL   Specific Gravity, Urine 1.015 1.005 - 1.030   Hgb urine dipstick LARGE (A) NEGATIVE   pH 5.0 5.0 - 8.0   Protein, ur >=300 (A) NEGATIVE mg/dL   Urobilinogen, UA 2.0 (H) 0.0 - 1.0 mg/dL   Nitrite POSITIVE (A) NEGATIVE   Leukocytes, UA LARGE (A) NEGATIVE   No results found.  Assessment and Plan: 44 y.o. female with urinary tract infection. Point-of-care urinalysis is likely grossly abnormal due to presence of AZO. Urine culture pending. Treatment  with Keflex  Discussed warning signs or symptoms. Please see discharge instructions. Patient expresses understanding.     Rodolph Bong, MD 01/21/15 2010

## 2015-01-21 NOTE — ED Notes (Addendum)
C/o UTI sx onset 2 days Sx include dysuria, urinary freq/urgency, lower back pain, abd pain and feeling nauseas Denies fevers, chills, gyn sx Taking OTC AZO Alert, no signs of acute distress.

## 2015-01-22 NOTE — ED Notes (Signed)
Rx written day of visit sufficient

## 2015-01-24 LAB — URINE CULTURE

## 2015-11-08 IMAGING — DX DG CHEST 2V
3 series · 3 of 3 positions shown · non-contrast
Comparison: None.

CLINICAL DATA: Patient has a for 1 week.

EXAM:
CHEST  2 VIEW

[chest pa]
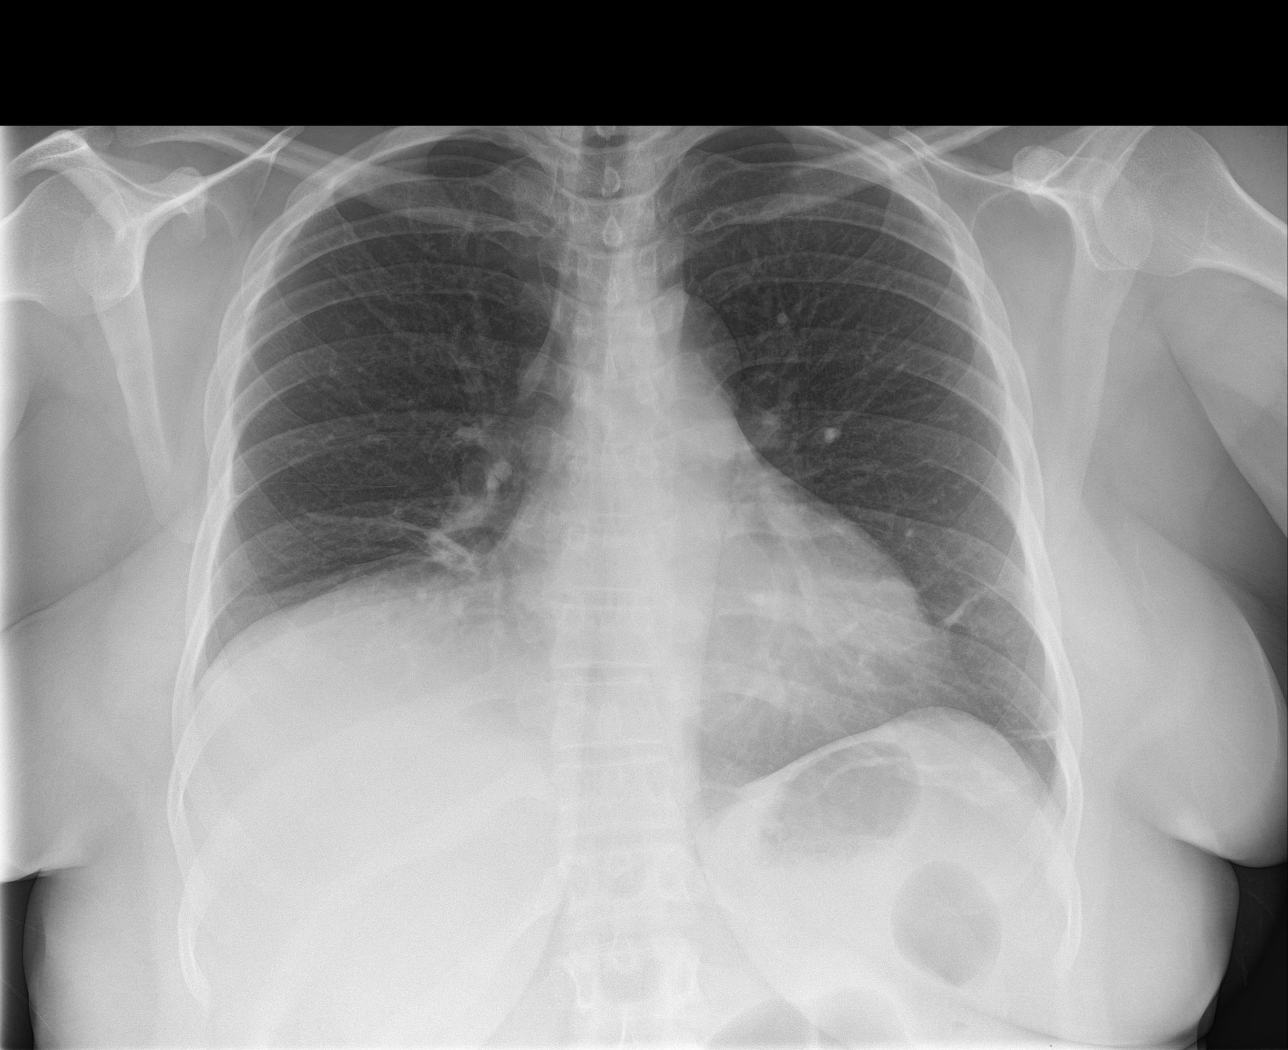

[chest lat (1 of 2)]
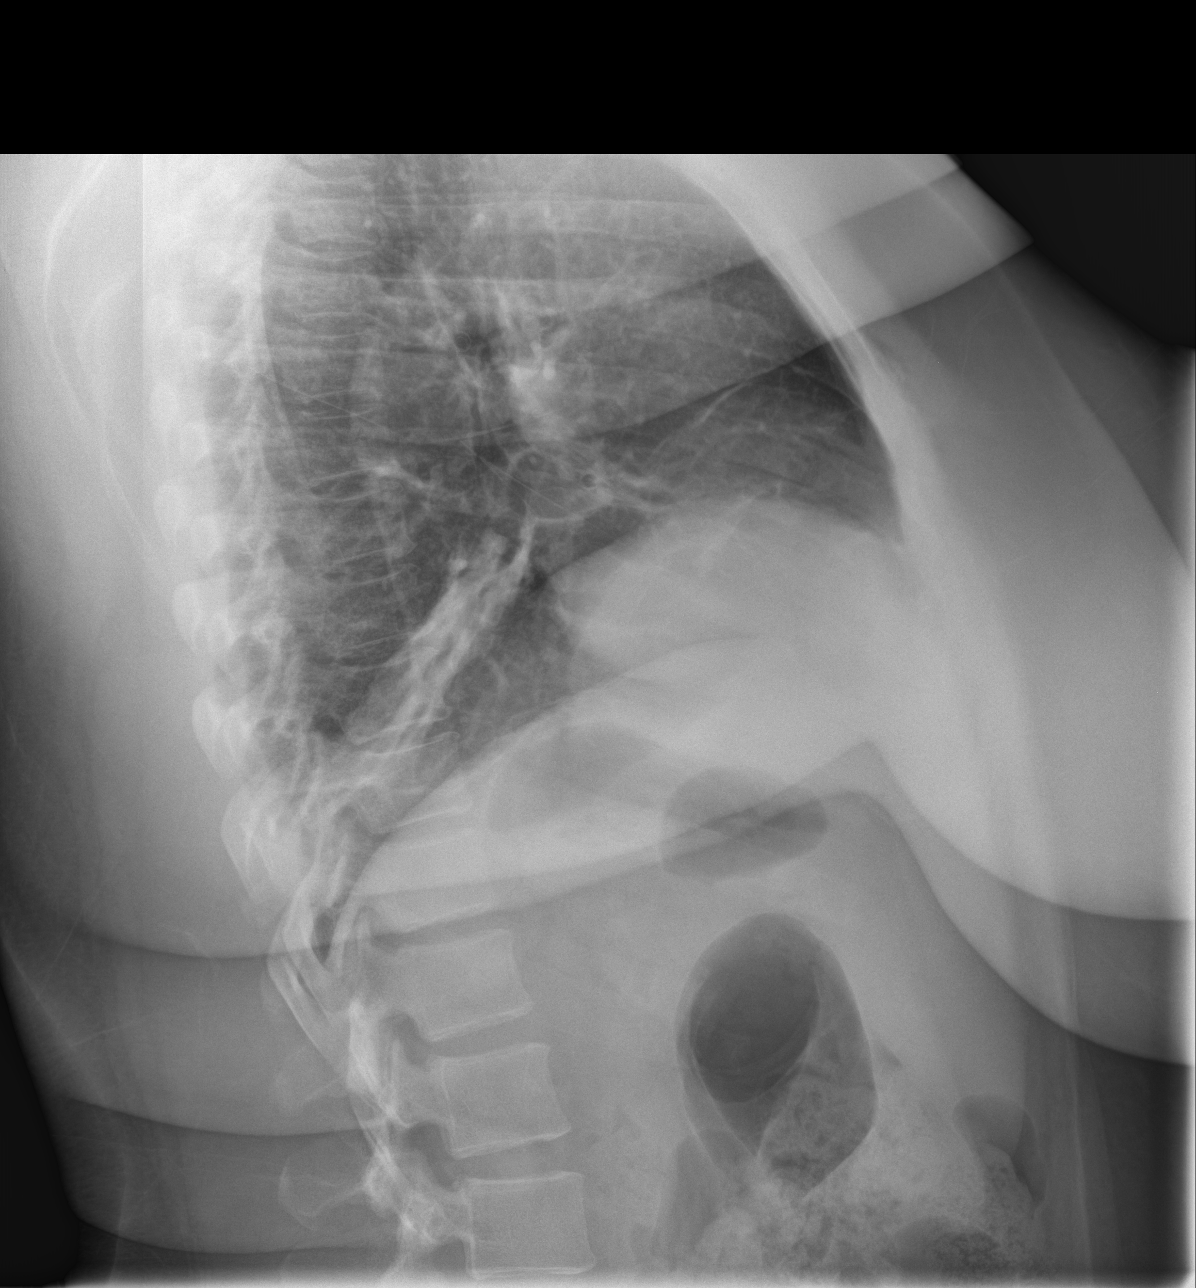

[chest lat (2 of 2)]
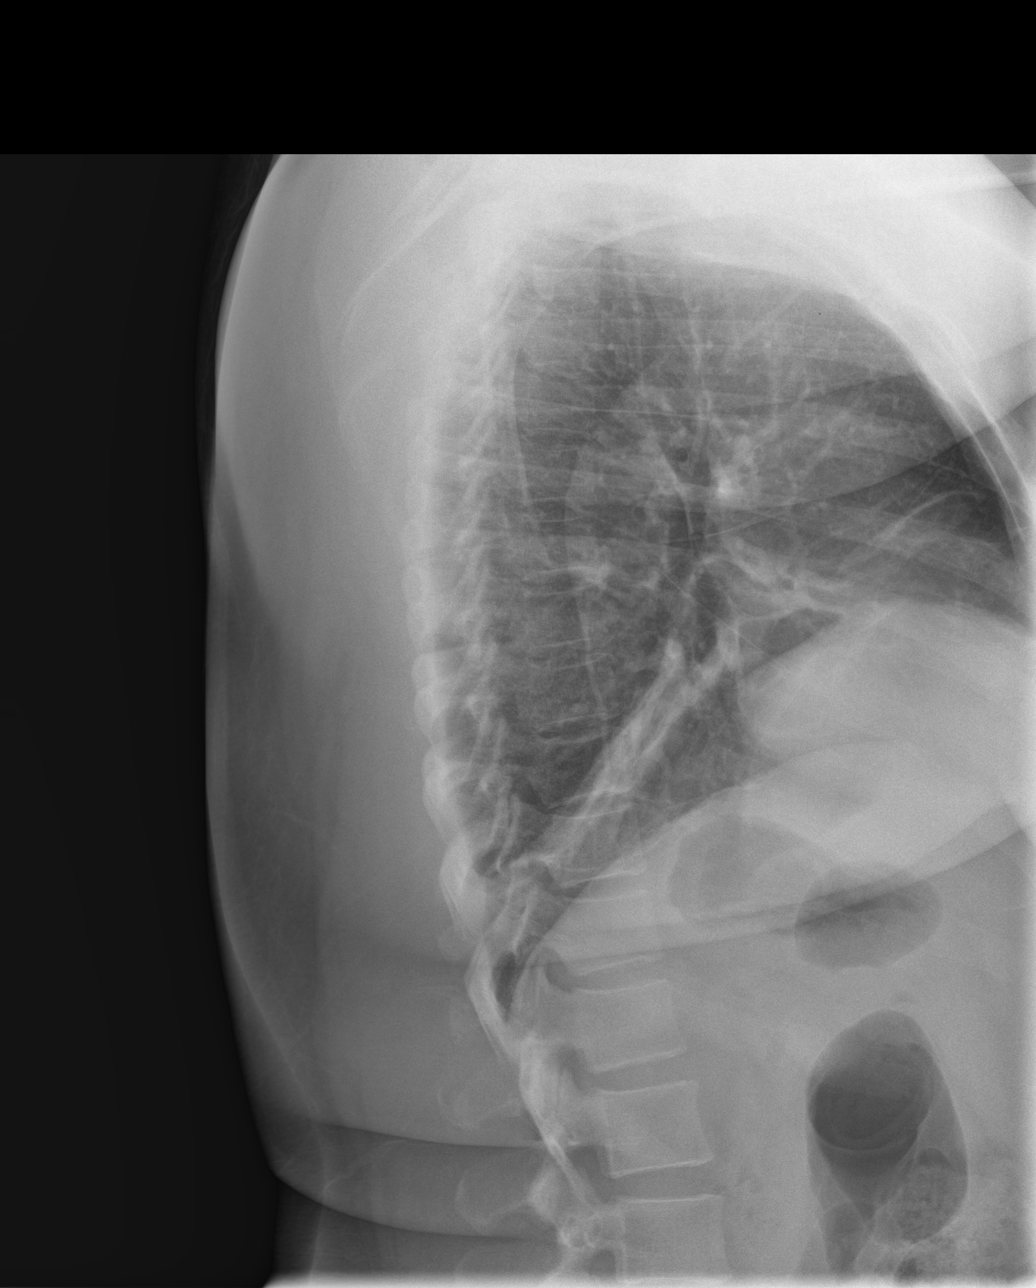

[3 of 3 positions shown; findings below may reference images not displayed]

FINDINGS: There is elevation of the right diaphragm. There is a linear band of
airspace disease at the left lung base likely reflecting atelectasis
or scarring. There is no focal parenchymal opacity, pleural
effusion, or pneumothorax. The heart and mediastinal contours are
unremarkable.

The osseous structures are unremarkable.
IMPRESSION: There is a linear band of airspace disease at the left lung base
likely reflecting atelectasis or scarring. No active cardiopulmonary
disease.

## 2016-10-20 ENCOUNTER — Ambulatory Visit (HOSPITAL_COMMUNITY)
Admission: EM | Admit: 2016-10-20 | Discharge: 2016-10-20 | Disposition: A | Discharge status: Home / Self Care | Payer: Self-pay | Attending: Emergency Medicine | Admitting: Emergency Medicine

## 2016-10-20 ENCOUNTER — Encounter (HOSPITAL_COMMUNITY): Payer: Self-pay | Admitting: *Deleted

## 2016-10-20 DIAGNOSIS — J111 Influenza due to unidentified influenza virus with other respiratory manifestations: Secondary | ICD-10-CM

## 2016-10-20 MED ORDER — IBUPROFEN 600 MG PO TABS: 600.0000 mg | ORAL_TABLET | Freq: Four times a day (QID) | ORAL | 0 refills | Status: AC | PRN

## 2016-10-20 MED ORDER — OSELTAMIVIR PHOSPHATE 75 MG PO CAPS: 75.0000 mg | ORAL_CAPSULE | Freq: Two times a day (BID) | ORAL | 0 refills | Status: AC

## 2016-10-20 MED ORDER — FLUTICASONE PROPIONATE 50 MCG/ACT NA SUSP: 2.0000 | Freq: Every day | NASAL | 0 refills | Status: AC

## 2016-10-20 NOTE — ED Provider Notes (Signed)
HPI  SUBJECTIVE:  Faith Taylor is a 46 y.o. female who presents with the acute onset of body aches, headaches, sneezing, nasal congestion, rhinorrhea, postnasal drip, scratchy, sore throat. This started yesterday. Patient states that she feels feverish with chills, but has not taken her temperature with a thermometer at home. She took Tylenol severe cold approximately 3 hours prior to evaluation which she states helped her symptoms. There are no aggravating factors. No coughing, wheezing, chest pain, shortness of breath. No photophobia, neck pain, rash. No sinus pain or pressure, ear pain. No abdominal pain, urinary complaints, skin infection. She did get a flu shot this year. No known contacts with the flu. Past medical history negative for diabetes, hypertension, immunocompromise. LMP: 2/28. Denies possibility of being pregnant. PMD: None    History reviewed. No pertinent past medical history.  History reviewed. No pertinent surgical history.  History reviewed. No pertinent family history.  Social History  Substance Use Topics  . Smoking status: Never Smoker  . Smokeless tobacco: Never Used  . Alcohol use No    No current facility-administered medications for this encounter.   Current Outpatient Prescriptions:  .  fluticasone (FLONASE) 50 MCG/ACT nasal spray, Place 2 sprays into both nostrils daily., Disp: 16 g, Rfl: 0 .  ibuprofen (ADVIL,MOTRIN) 600 MG tablet, Take 1 tablet (600 mg total) by mouth every 6 (six) hours as needed., Disp: 30 tablet, Rfl: 0 .  oseltamivir (TAMIFLU) 75 MG capsule, Take 1 capsule (75 mg total) by mouth 2 (two) times daily. X 5 days, Disp: 10 capsule, Rfl: 0  No Known Allergies   ROS  As noted in HPI.   Physical Exam  BP 128/86 (BP Location: Right Arm)   Pulse 114   Temp 99.4 F (37.4 C) (Oral)   Resp 16   LMP 10/11/2016   Constitutional: Well developed, well nourished, no acute distress Eyes: PERRL, EOMI, conjunctiva normal  bilaterally HENT: Normocephalic, atraumatic,mucus membranes moist, TMs normal bilaterally. Mild nasal congestion. Normal tonsils. Unable to fully visualize posterior oropharynx. Uvula midline. No sinus tenderness.  Neck: No cervical lymphadenopathy, meningismus Respiratory: Clear to auscultation bilaterally, no rales, no wheezing, no rhonchi Cardiovascular: Mild tachycardia, no murmurs, no gallops, no rubs GI: Soft, nondistended, normal bowel sounds, nontender, no rebound, no guarding Back: no CVAT skin: No rash, skin intact Musculoskeletal: No edema, no tenderness, no deformities Neurologic: Alert & oriented x 3, CN II-XII grossly intact, no motor deficits, sensation grossly intact Psychiatric: Speech and behavior appropriate   ED Course   Medications - No data to display  No orders of the defined types were placed in this encounter.  No results found for this or any previous visit (from the past 24 hour(s)). No results found.  ED Clinical Impression  Influenza   ED Assessment/Plan  Presentation consistent of influenza/flulike illness. Tachycardic, afebrile, but she did taken antipyretic within 6-8 hours of evaluation. Home with Tamiflu, Mucinex D, saline nasal irrigation, Flonase, ibuprofen 600 mg with 1 g of Tylenol 3-4 times a day, stop all other cold Medicines. We'll provide a primary care referral. .   Meds ordered this encounter  Medications  . fluticasone (FLONASE) 50 MCG/ACT nasal spray    Sig: Place 2 sprays into both nostrils daily.    Dispense:  16 g    Refill:  0  . oseltamivir (TAMIFLU) 75 MG capsule    Sig: Take 1 capsule (75 mg total) by mouth 2 (two) times daily. X 5 days    Dispense:  10 capsule    Refill:  0  . ibuprofen (ADVIL,MOTRIN) 600 MG tablet    Sig: Take 1 tablet (600 mg total) by mouth every 6 (six) hours as needed.    Dispense:  30 tablet    Refill:  0    *This clinic note was created using Scientist, clinical (histocompatibility and immunogenetics)Dragon dictation software. Therefore, there may  be occasional mistakes despite careful proofreading.  ?   Domenick GongAshley Krystyna Cleckley, MD 10/20/16 1032

## 2016-10-20 NOTE — ED Triage Notes (Signed)
Body  Aches   And  Fever      Scratchy      Sinus   Drainage   And  Congestion   Symptoms  Began  yesterday

## 2016-10-20 NOTE — Discharge Instructions (Signed)
Tamiflu, Mucinex D, saline nasal irrigation, Flonase, ibuprofen 600 mg with 1 g of Tylenol 3-4 times a day, stop all other cold Medicines.   Below is a list of primary care practices who are taking new patients for you to follow-up with. [facility] [address] [place], [place] [address] [phone]  [facility] [phone] [address] [place] [address]  [facility]: [address] [place] [id number]  [phone]  [facility]: [address] [place] [id number]   [phone]  [facility]: [address] [place] [address]  [phone]) [phone]  [facility] : [address] [place] [id number] [phone]  [facility]: [address] [place] [phone]  [facility]: [address]  [place] [id number] [phone]  Dr. Oneal GroutMahima Pandey [address] [facility] [address] [facility] [place] [id number]  [phone]  Dr. Jackie PlumGeorge Osei-Bonsu, [facility]. [address] [place] [address]  [phone]
